# Patient Record
Sex: Male | Born: 2000 | Race: Black or African American | Hispanic: No | Marital: Single | Smoking: Never smoker
Health system: Southern US, Community
[De-identification: ages and names within clinical notes are randomized; demographics above are authoritative.]

---
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
## 2022-03-28 IMAGING — DX DG ANKLE COMPLETE 3+V*L*
3 series · 3 of 3 positions shown · non-contrast
Comparison: None.

CLINICAL DATA: Left ankle injury

EXAM:
LEFT ANKLE COMPLETE - 3+ VIEW

[ankle ap]
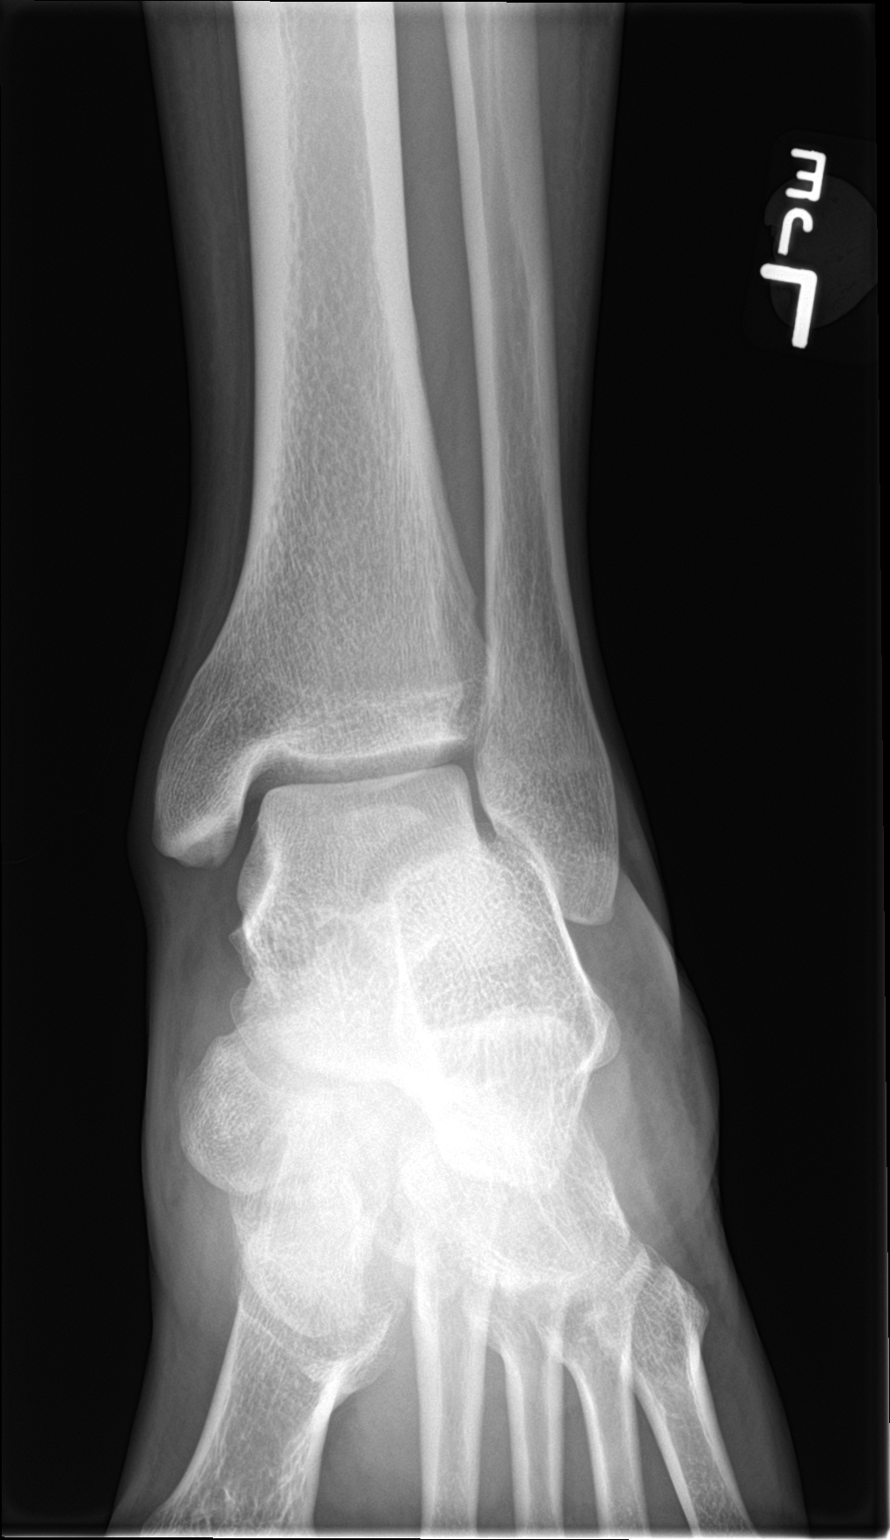

[ankle obl]
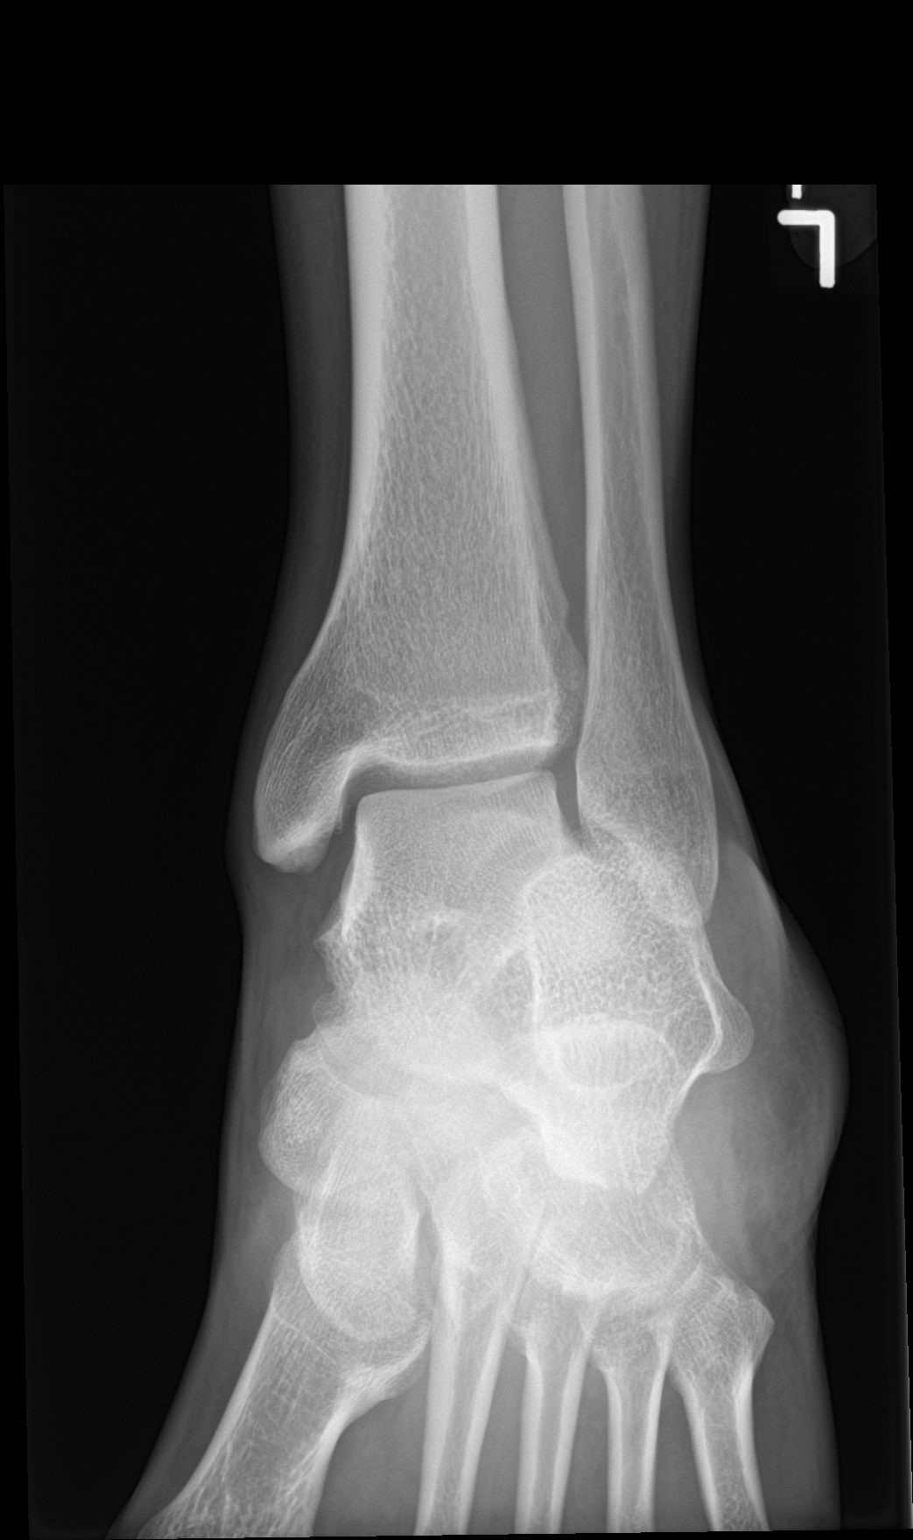

[ankle lat]
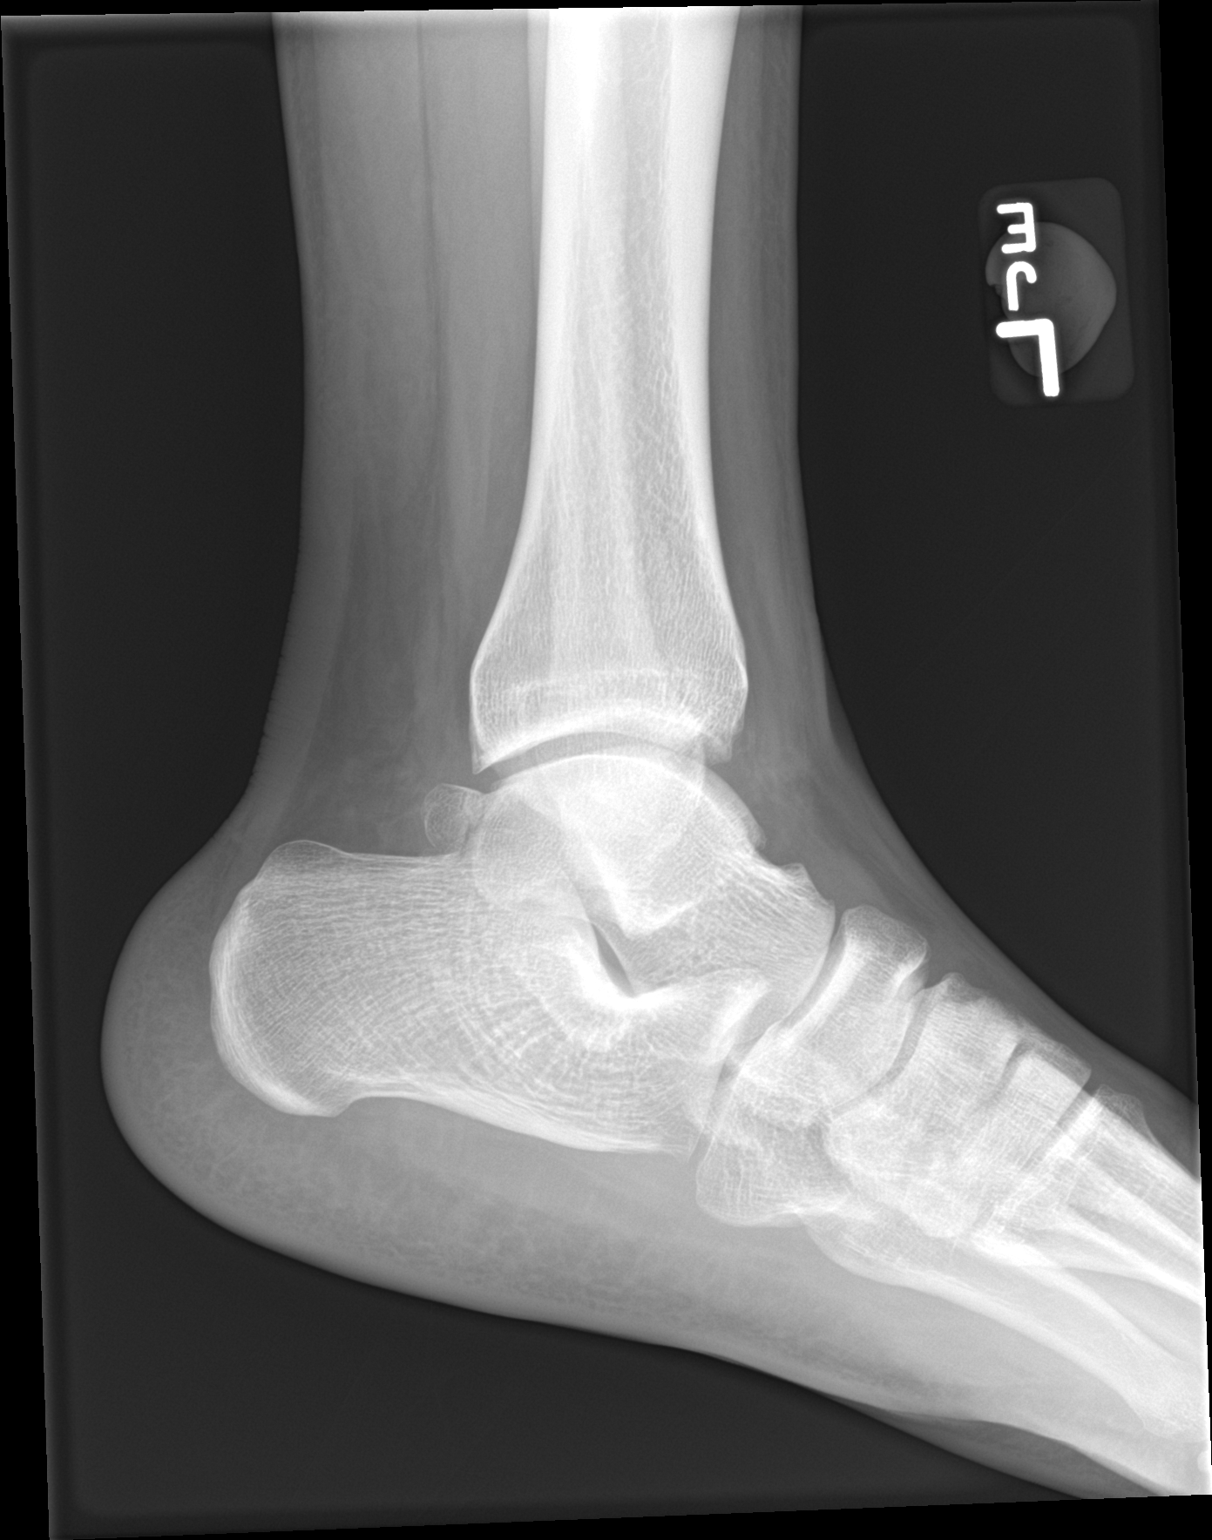

[3 of 3 positions shown; findings below may reference images not displayed]

FINDINGS: There is no evidence of fracture, dislocation, or joint effusion.
There is no evidence of arthropathy or other focal bone abnormality.
Soft tissues are unremarkable.
IMPRESSION: Negative.
